# Patient Record
Sex: Male | Born: 2005 | Race: White | Hispanic: No | Marital: Single | Smoking: Never smoker
Health system: Southern US, Community
[De-identification: ages and names within clinical notes are randomized; demographics above are authoritative.]

## PROBLEM LIST (undated history)

## (undated) DIAGNOSIS — T7840XA Allergy, unspecified, initial encounter: Secondary | ICD-10-CM

## (undated) DIAGNOSIS — K219 Gastro-esophageal reflux disease without esophagitis: Secondary | ICD-10-CM

## (undated) DIAGNOSIS — J45909 Unspecified asthma, uncomplicated: Secondary | ICD-10-CM

## (undated) DIAGNOSIS — J050 Acute obstructive laryngitis [croup]: Secondary | ICD-10-CM

## (undated) HISTORY — DX: Allergy, unspecified, initial encounter: T78.40XA

## (undated) HISTORY — DX: Gastro-esophageal reflux disease without esophagitis: K21.9

## (undated) HISTORY — PX: NO PAST SURGERIES: SHX2092

## (undated) HISTORY — DX: Unspecified asthma, uncomplicated: J45.909

## (undated) HISTORY — DX: Acute obstructive laryngitis (croup): J05.0

---
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
## 2023-04-22 ENCOUNTER — Encounter: Payer: Self-pay | Admitting: Family Medicine

## 2023-10-13 ENCOUNTER — Ambulatory Visit (INDEPENDENT_AMBULATORY_CARE_PROVIDER_SITE_OTHER): Admitting: Podiatry

## 2023-10-13 DIAGNOSIS — L602 Onychogryphosis: Secondary | ICD-10-CM | POA: Diagnosis not present

## 2023-10-13 MED ORDER — NEOMYCIN-POLYMYXIN-HC 3.5-10000-1 OT SUSP: OTIC | 0 refills | Status: AC

## 2023-10-13 NOTE — Patient Instructions (Signed)

## 2023-10-13 NOTE — Progress Notes (Signed)
 Subjective:  Patient ID: Bryce Gillespie, male    DOB: [birth year],  MRN: [id number]  Bryce Gillespie presents to clinic today for:  Chief Complaint  Patient presents with   Nail Problem    Right 1st nail is extremely thick and curled downwards toward base of toe. No drainage. Has had pain with weight bearing for the past couple of days. Has not really tried anything to help the nail because it will generally just fall off and grow back. Not diabetic, no anticoag.   Patient comes in with severe, chronic bilateral first toenail deformity.  Right is greater than left.  He has been dealing with it for a little over 9 years at this point.  Right first toe in particular is worse at this point, is extremely thickened, curved in appearance, discolored and oftentimes will get to the point will fall off on its own and grow back.  The left first toe behaves in a similar fashion but more recently fell off.  It is going back in a similar way.  The patient states that he would like the toenails removed permanently if possible.  The right one particular is causing pain today.  PCP is Cleatus Arlyss RAMAN, MD.  No Known Allergies  Review of Systems: Negative except as noted in the HPI.  Objective:  There were no vitals filed for this visit.  Bryce Gillespie is a pleasant 18 y.o. male in NAD. AAO x 3.  Vascular Examination: Capillary refill time is less than 3 seconds to toes bilateral. Palpable pedal pulses b/l LE. Digital hair present b/l. No pedal edema b/l. Skin temperature gradient WNL b/l. No varicosities b/l. No cyanosis or clubbing noted b/l.   Dermatological Examination: Right greater than left first toes there is severe nail plate thickening, deformity, discoloration with a rams horn like appearance.  There is some small blister formation present right lateral corner of the base of the nail.  Tender on direct dorsal palpation due to the thickness.  Neurological Examination: Protective sensation  intact with Semmes-Weinstein 10 gram monofilament b/l LE. Vibratory sensation intact b/l LE.      No data to display           Assessment/Plan: 1. Onychogryphosis     Meds ordered this encounter  Medications   neomycin -polymyxin-hydrocortisone (CORTISPORIN) 3.5-10000-1 OTIC suspension    Sig: Apply 1-2 drops daily after soaking and cover with bandaid    Dispense:  10 mL    Refill:  0   #Onychogryphosis bilateral great toenails -New patient visit -right greater than left - Discussed proceeding with total nail avulsion with chemical matrixectomy today -Risks, benefits were discussed with patient.  Written consent obtained.  Expressed good understanding and elected to proceed. -Prescription for Cortisporin Nail drops sent to patient's pharmacy for aftercare of the total nail avulsion sites. Discussed patient's condition today.    After obtaining patient consent, the left and right hallux was anesthetized with a 50:50 mixture of 1% lidocaine plain and 0.5% bupivacaine plain for a total of 3cc's administered to each toe.  Upon confirmation of anesthesia, a freer elevator was utilized to free the left and right nail plate from the nail bed.  The nail plates were then avulsed proximal to the eponychium and removed in toto.  The area was inspected for any remaining spicules.  A chemical matrixectomy was performed with phenol and neutralized with isopropyl alcohol solution.  Antibiotic ointment and a DSD were applied,  followed by a Coban dressing.  Patient tolerated the anesthetic and procedure well and will f/u in 2-3 weeks for recheck.  Patient given post-procedure instructions for daily 20-minute Epsom salt soaks, antibiotic Cortisporin drops and daily use of Bandaids until toe starts to dry / form eschar.  - Blood loss: 0 cc -Hemostasis: Rubber band tourniquet bilateral toes   Return in about 2 weeks (around 10/27/2023) for Nail Check.   Ethan Saddler, DPM, AACFAS [facility]     [address] [place], [place] [address]                Office [phone]  Fax [phone]

## 2023-11-03 ENCOUNTER — Ambulatory Visit: Admitting: Podiatry

## 2023-11-03 DIAGNOSIS — L602 Onychogryphosis: Secondary | ICD-10-CM | POA: Diagnosis not present

## 2023-11-03 NOTE — Progress Notes (Signed)
       Subjective:  Patient ID: Bryce Gillespie, male    DOB: 09-Oct-2005,  MRN: [id number]  Chief Complaint  Patient presents with   Nail Problem    Nail check  Bilateral great toe nail removal. No pain  some redness and swelling. Scabbed.  Not diabetic.  No anti coag    Bryce Gillespie presents to clinic today for f/u of total nail avulsion with chemical matrixectomy of bilateral first toes due to onychogryphosis.  Doing well.  No pain to the area.  Reports minimal drainage.  PCP is Cleatus Arlyss RAMAN, MD.  No Known Allergies  Objective:  There were no vitals filed for this visit.  Vascular Examination: Capillary refill time is less than 3 seconds to toes bilateral. Palpable pedal pulses b/l LE. Digital hair present b/l. No pedal edema b/l. Skin temperature gradient WNL b/l. No varicosities b/l. No cyanosis or clubbing noted b/l.   Dermatological Examination: Upon inspection of the bilateral first toe total nail avulsion site, there are no clinical signs of infection.  No purulence, no necrosis, no malodor present.  Minimal to no erythema present.  Stable scab present to the nailbed of each first toe.  Minimal to no pain on palpation of area.  Minimal fibrotic slough small areas of the scab proximally.  Assessment/Plan: 1. Onychogryphosis     No orders of the defined types were placed in this encounter.  Patient doing well status post total nail avulsion with chemical matrixectomy.  Encouraged him to continue to soak and bandage the toe until scab fully formed and drainage resolves.  He may air dry the toe at home and leave open to air, should otherwise use bandage when in shoes and during the day.  Expect the sites to continue to heal uneventfully.  Return if symptoms worsen or fail to improve.   Marinell LITTIE Saddler, DPM, FACFAS [facility]     [address] [place], [place] [address]                Office [phone]  Fax [phone]
